# Patient Record
Sex: Female | Born: 1975
Health system: Southern US, Community
[De-identification: ages and names within clinical notes are randomized; demographics above are authoritative.]

## PROBLEM LIST (undated history)

## (undated) DIAGNOSIS — I10 Essential (primary) hypertension: Secondary | ICD-10-CM

## (undated) HISTORY — PX: BREAST SURGERY: SHX581

---

## 2009-02-26 ENCOUNTER — Ambulatory Visit: Payer: Self-pay | Admitting: Diagnostic Radiology

## 2009-02-26 ENCOUNTER — Emergency Department (HOSPITAL_BASED_OUTPATIENT_CLINIC_OR_DEPARTMENT_OTHER): Admission: EM | Admit: 2009-02-26 | Discharge: 2009-02-26 | Payer: Self-pay | Admitting: Emergency Medicine

## 2011-05-24 IMAGING — CR DG CHEST 2V
2 series · 2 of 2 positions shown · non-contrast
Comparison: None

CLINICAL DATA: Cough

CHEST - 2 VIEW

[w chest pa]
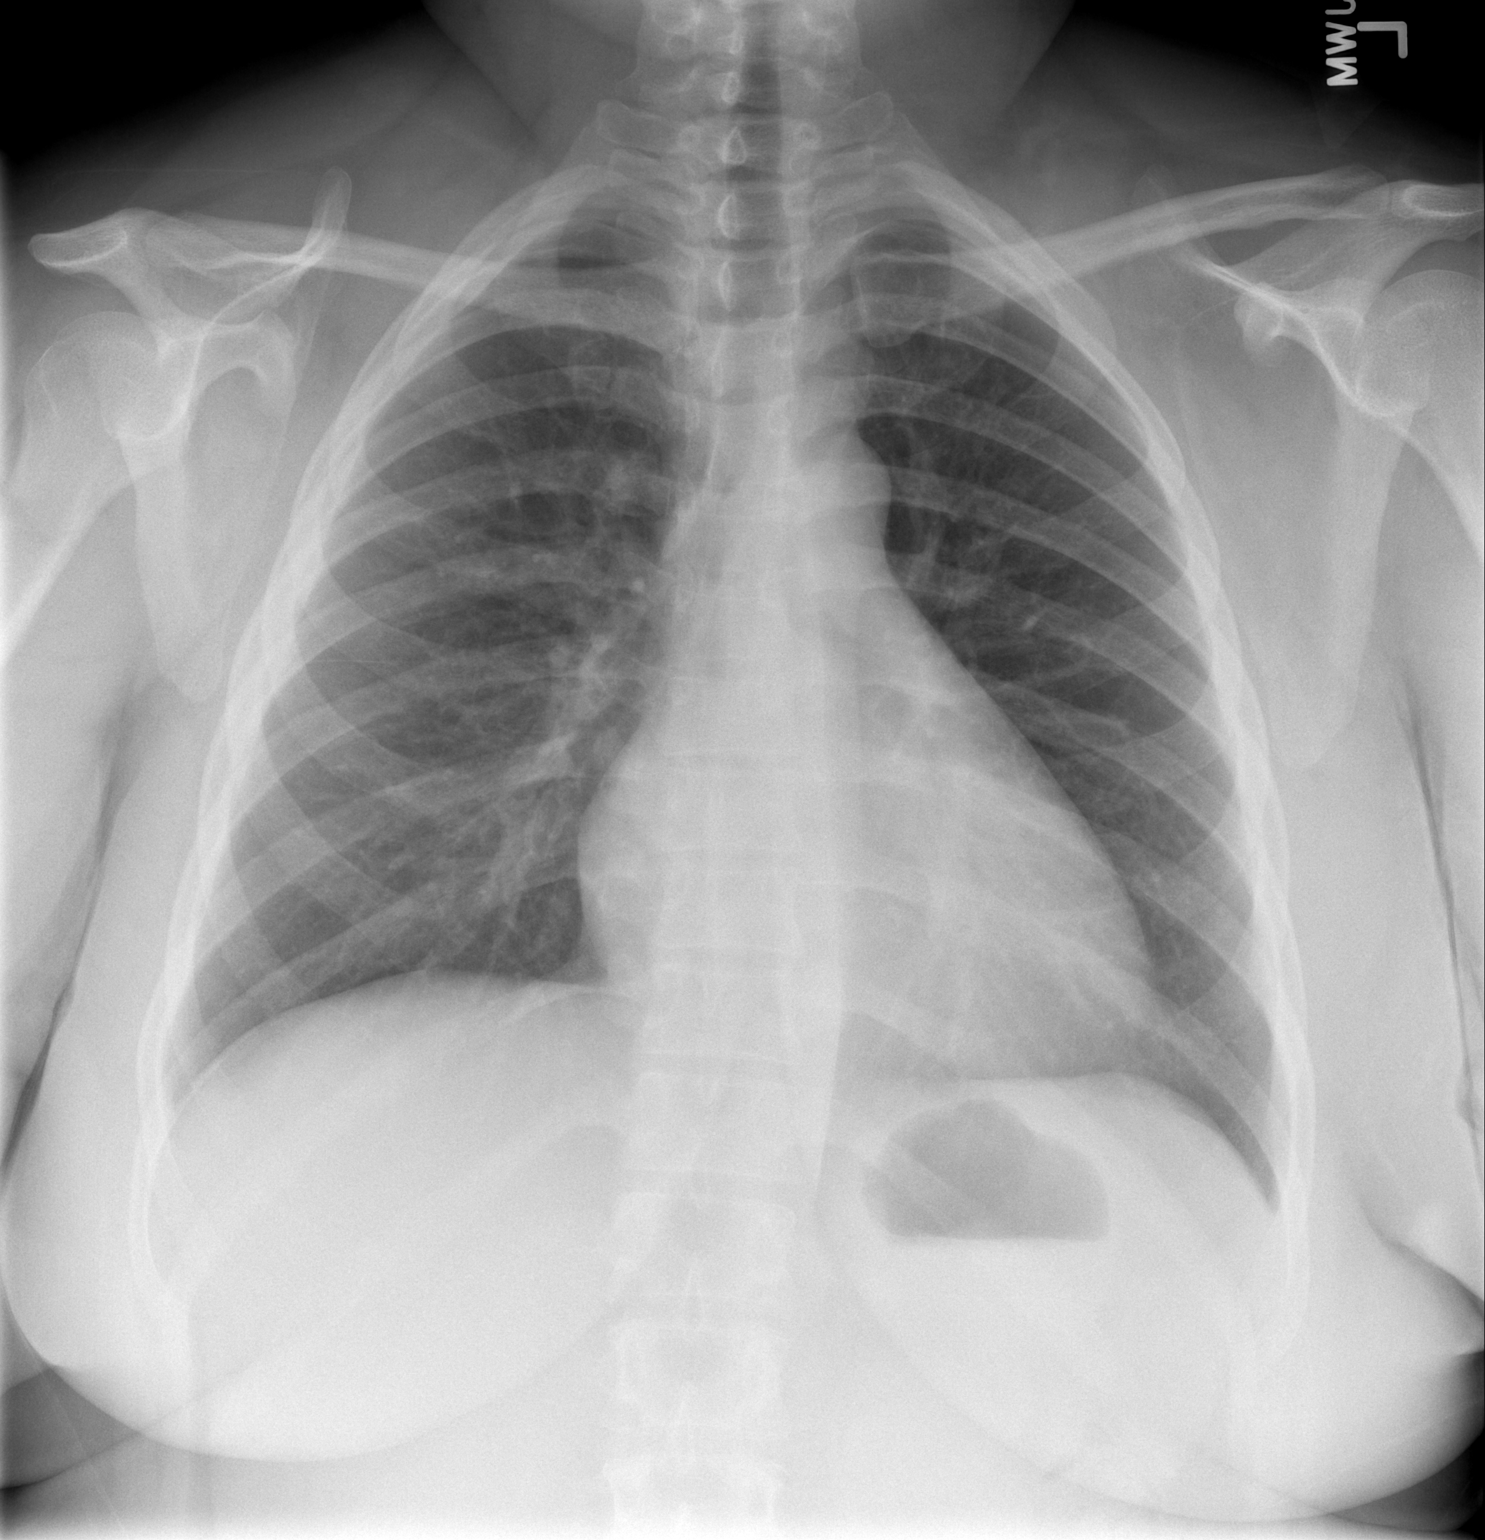

[w chest lat]
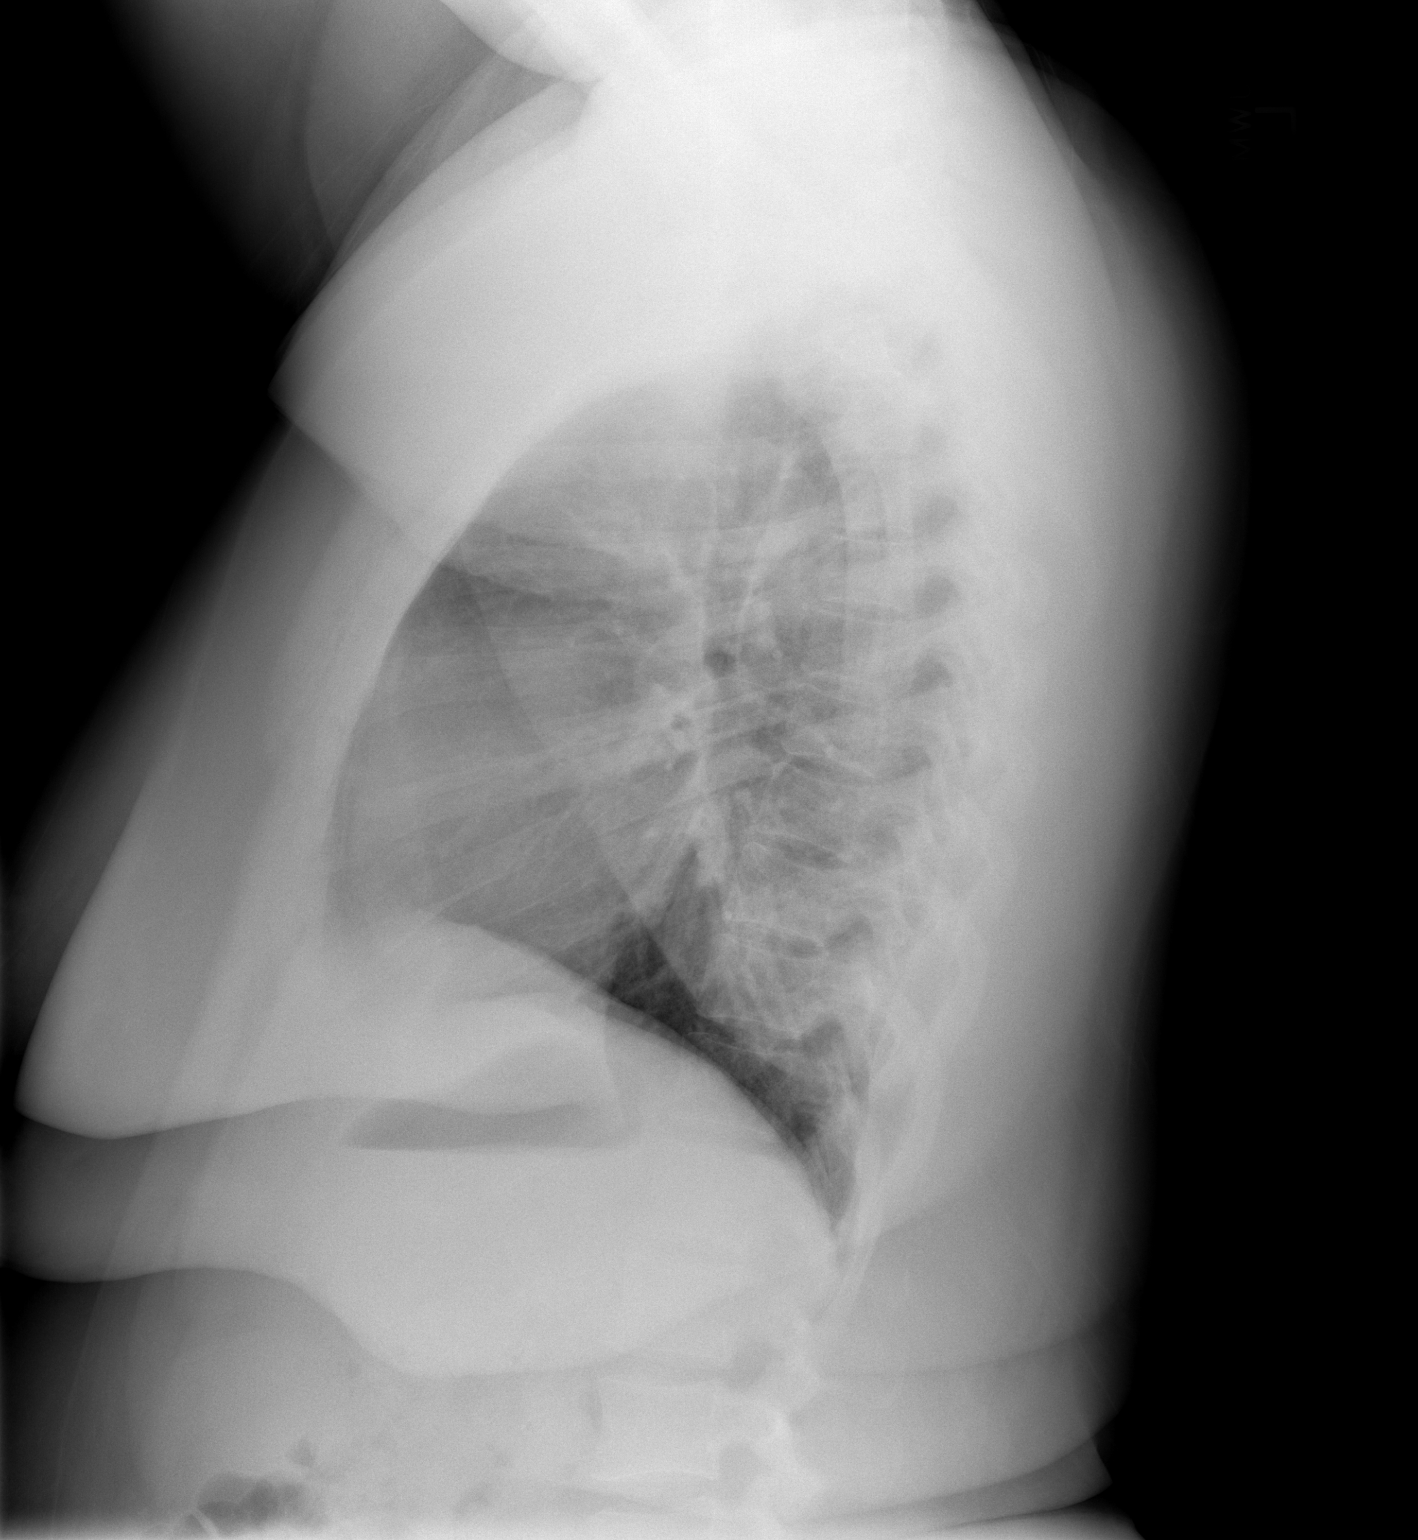

[2 of 2 positions shown; findings below may reference images not displayed]

FINDINGS: Cardiomediastinal silhouette is unremarkable.  No acute
infiltrate or pleural effusion.  No pulmonary edema. Bony thorax is
unremarkable.
IMPRESSION: No active disease.

## 2019-11-05 ENCOUNTER — Other Ambulatory Visit: Payer: Self-pay

## 2019-11-05 ENCOUNTER — Emergency Department (HOSPITAL_BASED_OUTPATIENT_CLINIC_OR_DEPARTMENT_OTHER)
Admission: EM | Admit: 2019-11-05 | Discharge: 2019-11-05 | Disposition: A | Discharge status: Home / Self Care | Payer: No Typology Code available for payment source | Attending: Emergency Medicine | Admitting: Emergency Medicine

## 2019-11-05 ENCOUNTER — Encounter (HOSPITAL_BASED_OUTPATIENT_CLINIC_OR_DEPARTMENT_OTHER): Payer: Self-pay | Admitting: *Deleted

## 2019-11-05 DIAGNOSIS — Z79899 Other long term (current) drug therapy: Secondary | ICD-10-CM | POA: Insufficient documentation

## 2019-11-05 DIAGNOSIS — Y92199 Unspecified place in other specified residential institution as the place of occurrence of the external cause: Secondary | ICD-10-CM | POA: Insufficient documentation

## 2019-11-05 DIAGNOSIS — Y9389 Activity, other specified: Secondary | ICD-10-CM | POA: Insufficient documentation

## 2019-11-05 DIAGNOSIS — I10 Essential (primary) hypertension: Secondary | ICD-10-CM | POA: Insufficient documentation

## 2019-11-05 DIAGNOSIS — Y990 Civilian activity done for income or pay: Secondary | ICD-10-CM | POA: Diagnosis not present

## 2019-11-05 DIAGNOSIS — Z23 Encounter for immunization: Secondary | ICD-10-CM | POA: Insufficient documentation

## 2019-11-05 DIAGNOSIS — S41151A Open bite of right upper arm, initial encounter: Secondary | ICD-10-CM | POA: Insufficient documentation

## 2019-11-05 DIAGNOSIS — W503XXA Accidental bite by another person, initial encounter: Secondary | ICD-10-CM

## 2019-11-05 HISTORY — DX: Essential (primary) hypertension: I10

## 2019-11-05 MED ORDER — AMOXICILLIN-POT CLAVULANATE 875-125 MG PO TABS
1.0000 | ORAL_TABLET | Freq: Once | ORAL | Status: AC
Start: 2019-11-05 — End: 2019-11-05
  Administered 2019-11-05: 1 via ORAL
  Filled 2019-11-05: qty 1

## 2019-11-05 MED ORDER — AMOXICILLIN-POT CLAVULANATE 875-125 MG PO TABS
1.0000 | ORAL_TABLET | Freq: Once | ORAL | Status: DC
Start: 2019-11-05 — End: 2019-11-05

## 2019-11-05 MED ORDER — AMOXICILLIN-POT CLAVULANATE 875-125 MG PO TABS: 1.0000 | ORAL_TABLET | Freq: Two times a day (BID) | ORAL | 0 refills | Status: AC

## 2019-11-05 MED ORDER — BACITRACIN ZINC 500 UNIT/GM EX OINT
TOPICAL_OINTMENT | Freq: Two times a day (BID) | CUTANEOUS | Status: DC
  Administered 2019-11-05: 1 via TOPICAL
  Filled 2019-11-05: qty 28.35

## 2019-11-05 MED ORDER — TETANUS-DIPHTH-ACELL PERTUSSIS 5-2.5-18.5 LF-MCG/0.5 IM SUSP
0.5000 mL | Freq: Once | INTRAMUSCULAR | Status: AC
Start: 2019-11-05 — End: 2019-11-05
  Administered 2019-11-05: 0.5 mL via INTRAMUSCULAR
  Filled 2019-11-05: qty 0.5

## 2019-11-05 NOTE — ED Triage Notes (Signed)
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

## 2019-11-05 NOTE — Discharge Instructions (Signed)
Please keep the wound clean and dry and make sure you follow-up in 2 days for recheck.  Return sooner if you have any new or worsening symptoms

## 2019-11-05 NOTE — ED Provider Notes (Signed)
[facility] EMERGENCY DEPARTMENT Provider Note   CSN: [id number] Arrival date & time: 11/05/19  1146     History Chief Complaint  Patient presents with  . Human Bite    Bianca Rodriguez is a 44 y.o. female.  Patient is a 44 year old female with past medical history of hypertension presenting to the emergency department for human bite to her right upper arm which occurred at work just prior to arrival.  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  Reports she was helping with a patient and the patient bit her in the arm.  Last tetanus shot 2012.        Past Medical History:  Diagnosis Date  . Hypertension     There are no problems to display for this patient.   Past Surgical History:  Procedure Laterality Date  . BREAST SURGERY    . CESAREAN SECTION       OB History   No obstetric history on file.     No family history on file.  Social History   Tobacco Use  . Smoking status: Never Smoker  . Smokeless tobacco: Never Used  Substance Use Topics  . Alcohol use: Never  . Drug use: Never    Home Medications Prior to Admission medications   Medication Sig Start Date End Date Taking? Authorizing Provider  chlorthalidone (HYGROTON) 50 MG tablet Take by mouth. 07/03/15  Yes [provider]  losartan (COZAAR) 25 MG tablet Take by mouth. 11/10/17  Yes [provider]  PHENTERMINE HCL PO Take by mouth.   Yes [provider]    Allergies    Lisinopril  Review of Systems   Review of Systems  Constitutional: Negative for fever.  Skin: Positive for wound.  Allergic/Immunologic: Negative for immunocompromised state.  All other systems reviewed and are negative.   Physical Exam Updated Vital Signs BP (!) 136/95   Pulse 100   Temp 98.6 F (37 C) (Oral)   Resp 18   Ht 4\' 11"  (1.499 m)   Wt 88.9 kg   LMP 10/12/2019   SpO2 100%   BMI 39.59 kg/m   Physical Exam Vitals and nursing note reviewed.    Constitutional:      Appearance: Normal appearance.  HENT:     Head: Normocephalic.  Eyes:     Conjunctiva/sclera: Conjunctivae normal.  Pulmonary:     Effort: Pulmonary effort is normal.  Skin:    General: Skin is dry.     Comments: There is 1/2 cm x 1 cm abrasion to the lateral right upper arm.  The abrasion is about 3 mm thick.  There is an area of missing skin which appears to be bitten off.  Neurological:     Mental Status: She is alert.  Psychiatric:        Mood and Affect: Mood normal.     ED Results / Procedures / Treatments   Labs (all labs ordered are listed, but only abnormal results are displayed) Labs Reviewed - No data to display  EKG None  Radiology No results found.  Procedures Procedures (including critical care time)  Medications Ordered in ED Medications  bacitracin ointment (1 application Topical Given 11/05/19 1355)  amoxicillin-clavulanate (AUGMENTIN) 875-125 MG per tablet 1 tablet (has no administration in time range)  amoxicillin-clavulanate (AUGMENTIN) 875-125 MG per tablet 1 tablet (1 tablet Oral Given 11/05/19 1356)  Tdap (BOOSTRIX) injection 0.5 mL (0.5 mLs Intramuscular Given 11/05/19 1357)    ED  Course  I have reviewed the triage vital signs and the nursing notes.  Pertinent labs & imaging results that were available during my care of the patient were reviewed by me and considered in my medical decision making (see chart for details).  Clinical Course as of Nov 05 1419  Tue Nov 05, 2019  1420 Patient with human bite from dementia patient at work today.  Tetanus shot was updated.  She was started on Augmentin.  The wound was cleaned and dressed with bacitracin.  It is a deep abrasion/bite mark with skin missing which is not amendable to suturing at this time.  Advised to follow-up in 2 days for wound check.   [KM]    Clinical Course User Index [KM] Jeral Pinch   MDM Rules/Calculators/A&P                          Based on  review of vitals, medical screening exam, lab work and/or imaging, there does not appear to be an acute, emergent etiology for the patient's symptoms. Counseled pt on good return precautions and encouraged both PCP and ED follow-up as needed.  Prior to discharge, I also discussed incidental imaging findings with patient in detail and advised appropriate, recommended follow-up in detail.  Clinical Impression: 1. Human bite, initial encounter     Disposition: Discharge  Prior to providing a prescription for a controlled substance, I independently reviewed the patient's recent prescription history on the [place] Controlled Substance Reporting System. The patient had no recent or regular prescriptions and was deemed appropriate for a brief, less than 3 day prescription of narcotic for acute analgesia.  This note was prepared with assistance of Conservation officer, historic buildings. Occasional wrong-word or sound-a-like substitutions may have occurred due to the inherent limitations of voice recognition software.  Final Clinical Impression(s) / ED Diagnoses Final diagnoses:  Human bite, initial encounter    Rx / DC Orders ED Discharge Orders    None       Jeral Pinch 11/05/19 1421    Tegeler, Canary Brim, MD 11/05/19 1558

## 2019-11-08 ENCOUNTER — Encounter (HOSPITAL_BASED_OUTPATIENT_CLINIC_OR_DEPARTMENT_OTHER): Payer: Self-pay

## 2019-11-08 ENCOUNTER — Other Ambulatory Visit: Payer: Self-pay

## 2019-11-08 ENCOUNTER — Emergency Department (HOSPITAL_BASED_OUTPATIENT_CLINIC_OR_DEPARTMENT_OTHER)
Admission: EM | Admit: 2019-11-08 | Discharge: 2019-11-08 | Disposition: A | Discharge status: Home / Self Care | Payer: No Typology Code available for payment source | Attending: Emergency Medicine | Admitting: Emergency Medicine

## 2019-11-08 DIAGNOSIS — I10 Essential (primary) hypertension: Secondary | ICD-10-CM | POA: Diagnosis not present

## 2019-11-08 DIAGNOSIS — Z5189 Encounter for other specified aftercare: Secondary | ICD-10-CM

## 2019-11-08 DIAGNOSIS — Z79899 Other long term (current) drug therapy: Secondary | ICD-10-CM | POA: Insufficient documentation

## 2019-11-08 DIAGNOSIS — Z4800 Encounter for change or removal of nonsurgical wound dressing: Secondary | ICD-10-CM | POA: Insufficient documentation

## 2019-11-08 DIAGNOSIS — L03113 Cellulitis of right upper limb: Secondary | ICD-10-CM | POA: Insufficient documentation

## 2019-11-08 MED ORDER — SULFAMETHOXAZOLE-TRIMETHOPRIM 800-160 MG PO TABS: 1.0000 | ORAL_TABLET | Freq: Two times a day (BID) | ORAL | 0 refills | Status: AC

## 2019-11-08 MED ORDER — LIDOCAINE-EPINEPHRINE 1 %-1:100000 IJ SOLN
INTRAMUSCULAR | Status: AC
  Filled 2019-11-08: qty 1

## 2019-11-08 MED ORDER — LIDOCAINE-EPINEPHRINE (PF) 2 %-1:200000 IJ SOLN: 10.0000 mL | Freq: Once | INTRAMUSCULAR | Status: DC

## 2019-11-08 MED FILL — SULFAMETHOXAZOLE-TMP DS TAB: 800-160 | 7 days supply | Qty: 14 | Fill #0

## 2019-11-08 NOTE — ED Provider Notes (Signed)
[facility] EMERGENCY DEPARTMENT Provider Note   CSN: [id number] Arrival date & time: 11/08/19  1134     History Chief Complaint  Patient presents with  . Wound Check    Bianca Rodriguez is a 44 y.o. female with past medical history of hypertension, prediabetes, who presents today for evaluation of a wound check.  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  She was started on Augmentin and her tetanus was updated.  She reports that since yesterday she has noted worsening redness and swelling.  She denies any changes in the pain.  No fevers.  She reports compliance with the Augmentin that she was prescribed and has been putting topical antibiotic ointment on it.  She denies any new pain up the arm. She feels overall well.   HPI     Past Medical History:  Diagnosis Date  . Hypertension     There are no problems to display for this patient.   Past Surgical History:  Procedure Laterality Date  . BREAST SURGERY    . CESAREAN SECTION       OB History   No obstetric history on file.     No family history on file.  Social History   Tobacco Use  . Smoking status: Never Smoker  . Smokeless tobacco: Never Used  Substance Use Topics  . Alcohol use: Never  . Drug use: Never    Home Medications Prior to Admission medications   Medication Sig Start Date End Date Taking? Authorizing Provider  amoxicillin-clavulanate (AUGMENTIN) 875-125 MG tablet Take 1 tablet by mouth every 12 (twelve) hours. 11/05/19   Arlyn Dunning, PA-C  chlorthalidone (HYGROTON) 50 MG tablet Take by mouth. 07/03/15   [provider]  losartan (COZAAR) 25 MG tablet Take by mouth. 11/10/17   [provider]  PHENTERMINE HCL PO Take by mouth.    [provider]  sulfamethoxazole-trimethoprim (BACTRIM DS) 800-160 MG tablet Take 1 tablet by mouth 2 (two) times daily for 7 days. 11/08/19 11/15/19  Cristina Gong, PA-C    Allergies    Lisinopril and Hydrochlorothiazide  Review of Systems   Review of Systems  Constitutional: Negative for chills and fever.  Gastrointestinal: Negative for abdominal pain.  Skin: Positive for color change and wound.  Neurological: Negative for weakness and headaches.  All other systems reviewed and are negative.   Physical Exam Updated Vital Signs BP 132/84 (BP Location: Right Arm)   Pulse 73   Temp 97.7 F (36.5 C) (Oral)   Resp 16   Ht 4\' 11"  (1.499 m)   Wt 87.5 kg   LMP 10/12/2019   SpO2 99%   BMI 38.96 kg/m   Physical Exam Vitals and nursing note reviewed.  Constitutional:      General: She is not in acute distress.    Appearance: She is not ill-appearing.  HENT:     Head: Normocephalic.  Cardiovascular:     Rate and Rhythm: Normal rate.  Pulmonary:     Effort: Pulmonary effort is normal. No respiratory distress.  Musculoskeletal:     Comments: Full active range of motion of the right upper extremity  Skin:    Comments: Please see clinical pictures.  There is devitalized skin present in the wound with surrounding edema and erythema.  The area is warm to the touch.  No palpable fluctuance however mild surrounding induration.  There is adipose  tissue visualized in the wound bed however no active purulent drainage.  Neurological:     General: No focal deficit present.     Mental Status: She is alert.  Psychiatric:        Mood and Affect: Mood normal.        Behavior: Behavior normal.           ED Results / Procedures / Treatments   Labs (all labs ordered are listed, but only abnormal results are displayed) Labs Reviewed - No data to display  EKG None  Radiology No results found.  Procedures .Marland KitchenIncision and Drainage  Date/Time: 11/08/2019 3:38 PM Performed by: Cristina Gong, PA-C Authorized by: Cristina Gong, PA-C   Consent:    Consent obtained:  Verbal   Consent given by:  Patient   Risks  discussed:  Bleeding, incomplete drainage, pain, infection and damage to other organs   Alternatives discussed:  No treatment, alternative treatment and referral Location:    Type:  Fluid collection   Size:  3x3cm   Location:  Upper extremity   Upper extremity location:  Arm   Arm location:  R upper arm Pre-procedure details:    Skin preparation:  Betadine Anesthesia (see MAR for exact dosages):    Anesthesia method:  Local infiltration   Local anesthetic:  Lidocaine 1% WITH epi Procedure type:    Complexity:  Complex Procedure details:    Needle aspiration: no     Incision type: Stab incision with debridement of nonviable tissue.   Incision depth:  Subcutaneous   Scalpel blade:  11   Wound management:  Debrided   Drainage:  Bloody and serous   Drainage amount:  Scant   Wound treatment:  Wound left open   Packing materials:  None Post-procedure details:    Patient tolerance of procedure:  Tolerated well, no immediate complications Comments:     Repeat bedside ultrasound was used during procedure (please see note by Dr. Pilar Plate for initial), after devitalized tissue was removed there was a small amount of drainage.  Repeat ultrasound using sterile ultrasound probe cover and jelly shows no clear residual fluid collection.    (including critical care time)  Medications Ordered in ED Medications  lidocaine-EPINEPHrine (XYLOCAINE W/EPI) 2 %-1:200000 (PF) injection 10 mL (has no administration in time range)  lidocaine-EPINEPHrine (XYLOCAINE W/EPI) 1 %-1:100000 (with pres) injection (has no administration in time range)    ED Course  I have reviewed the triage vital signs and the nursing notes.  Pertinent labs & imaging results that were available during my care of the patient were reviewed by me and considered in my medical decision making (see chart for details).    MDM Rules/Calculators/A&P                         Patient is a 44 year old woman who presents today for  evaluation of a wound check.  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  She noted worsening redness swelling and yesterday.  On exam concern for infection.  She has been taking Augmentin and not missing any doses.  Overall she is afebrile, not tachycardic or tachypneic, without systemic symptoms and is well-appearing.  Ultrasound soft tissue was performed showing concern for abscess.  I&D was performed, wound was debrided, after which repeat ultrasound did not show any residual fluid collection. Given her worsening will add bactrim for MRSA coverage.  I suspect that with the removal of the devitalized tissue (approx 1cmx0.75cm) the wound will improve given suspected source control.   Recommended that she follow-up with her primary care doctor on Monday for a wound recheck, or return to the emergency room if unable to be seen.  Additionally recommended that if her symptoms worsen, she develops fevers, proximal red streaking, worsening pain, or has other concerns she needs to return to the emergency room for further evaluation.    Return precautions were discussed with patient who states their understanding.  At the time of discharge patient denied any unaddressed complaints or concerns.  Patient is agreeable for discharge home.  Note: Portions of this report may have been transcribed using voice recognition software. Every effort was made to ensure accuracy; however, inadvertent computerized transcription errors may be present  This patient was seen as a shared visit with Dr. Pilar Plate.    Final Clinical Impression(s) / ED Diagnoses Final diagnoses:  Visit for wound check    Rx / DC Orders ED Discharge Orders         Ordered    sulfamethoxazole-trimethoprim (BACTRIM DS) 800-160 MG tablet  2 times daily     Discontinue  Reprint     11/08/19 1447           Cristina Gong, [place] 11/08/19 1545    Sabas Sous, MD 11/11/19 [phone]

## 2019-11-08 NOTE — ED Triage Notes (Signed)
Pt arrives ambulatory for wound recheck to right upper outer arm from human bite at work. Pt has been using antibiotic prescribed at last visit and using ointment.

## 2019-11-08 NOTE — Discharge Instructions (Signed)
Today I have given you a prescription for an additional antibiotic.  I suspect that your wound was worsening due to the dead skin.  Please continue to take the Augmentin in addition to the Bactrim.  Please take Ibuprofen (Advil, motrin) and Tylenol (acetaminophen) to relieve your pain.  You may take up to 600 MG (3 pills) of normal strength ibuprofen every 8 hours as needed.  In between doses of ibuprofen you make take tylenol, up to 1,000 mg (two extra strength pills).  Do not take more than 3,000 mg tylenol in a 24 hour period.  Please check all medication labels as many medications such as pain and cold medications may contain tylenol.  Do not drink alcohol while taking these medications.  Do not take other NSAID'S while taking ibuprofen (such as aleve or naproxen).  Please take ibuprofen with food to decrease stomach upset.  You may have diarrhea from the antibiotics.  It is very important that you continue to take the antibiotics even if you get diarrhea unless a medical professional tells you that you may stop taking them.  If you stop too early the bacteria you are being treated for will become stronger and you may need different, more powerful antibiotics that have more side effects and worsening diarrhea.  Please stay well hydrated and consider probiotics as they may decrease the severity of your diarrhea.  Please be aware that if you take any hormonal contraception (birth control pills, nexplanon, the ring, etc) that your birth control will not work while you are taking antibiotics and you need to use back up protection as directed on the birth control medication information insert.

## 2019-11-11 NOTE — ED Provider Notes (Signed)
Ultrasound ED Soft Tissue  Date/Time: 11/11/2019 7:01 AM Performed by: Sabas Sous, MD Authorized by: Sabas Sous, MD   Procedure details:    Indications: localization of abscess and evaluate for cellulitis     Transverse view:  Visualized   Longitudinal view:  Visualized   Images: archived   Location:    Location: upper extremity     Side:  Right Findings:     abscess present    cellulitis present Comments:     Small superficial hypoechoic and heterogeneous collection, potential abscess      Sabas Sous, MD 11/11/19 [phone]

## 2020-03-22 ENCOUNTER — Other Ambulatory Visit: Payer: Self-pay

## 2020-03-22 ENCOUNTER — Emergency Department (HOSPITAL_BASED_OUTPATIENT_CLINIC_OR_DEPARTMENT_OTHER)
Admission: EM | Admit: 2020-03-22 | Discharge: 2020-03-22 | Disposition: A | Discharge status: Home / Self Care | Payer: 59 | Attending: Emergency Medicine | Admitting: Emergency Medicine

## 2020-03-22 ENCOUNTER — Encounter (HOSPITAL_BASED_OUTPATIENT_CLINIC_OR_DEPARTMENT_OTHER): Payer: Self-pay | Admitting: Emergency Medicine

## 2020-03-22 DIAGNOSIS — I10 Essential (primary) hypertension: Secondary | ICD-10-CM | POA: Diagnosis not present

## 2020-03-22 DIAGNOSIS — R059 Cough, unspecified: Secondary | ICD-10-CM | POA: Diagnosis present

## 2020-03-22 DIAGNOSIS — Z20822 Contact with and (suspected) exposure to covid-19: Secondary | ICD-10-CM | POA: Insufficient documentation

## 2020-03-22 DIAGNOSIS — Z79899 Other long term (current) drug therapy: Secondary | ICD-10-CM | POA: Insufficient documentation

## 2020-03-22 DIAGNOSIS — J069 Acute upper respiratory infection, unspecified: Secondary | ICD-10-CM | POA: Insufficient documentation

## 2020-03-22 LAB — GROUP A STREP BY PCR: Group A Strep by PCR: NOT DETECTED

## 2020-03-22 LAB — RESP PANEL BY RT-PCR (FLU A&B, COVID) ARPGX2
Influenza A by PCR: NEGATIVE
Influenza B by PCR: NEGATIVE
SARS Coronavirus 2 by RT PCR: NEGATIVE

## 2020-03-22 NOTE — ED Triage Notes (Signed)
Reports sore throat on Friday.  Had a rapid that was negative for covid.  Symptoms getting worse since.  Cough, congestion, SOB, body aches, runny nose, ear pain, headache, and fatigue.

## 2020-03-22 NOTE — ED Provider Notes (Signed)
[facility] EMERGENCY DEPARTMENT Provider Note   CSN: [id number] Arrival date & time: 03/22/20  1630     History Chief Complaint  Patient presents with  . URI    Bianca Rodriguez is a 44 y.o. female.  HPI Patient presents with URI symptoms.  Sore throat initially then had cough and myalgias.  Started around 2 to 3 days ago.  Had negative rapid Covid test.  Has had runny nose headache some fatigue.  Works at a memory care unit.  States there are no known issues going around right now.  Has had her Covid vaccine.  No swelling in her legs.  No difficulty swallowing.    Past Medical History:  Diagnosis Date  . Hypertension     There are no problems to display for this patient.   Past Surgical History:  Procedure Laterality Date  . BREAST SURGERY    . CESAREAN SECTION       OB History   No obstetric history on file.     No family history on file.  Social History   Tobacco Use  . Smoking status: Never Smoker  . Smokeless tobacco: Never Used  Substance Use Topics  . Alcohol use: Never  . Drug use: Never    Home Medications Prior to Admission medications   Medication Sig Start Date End Date Taking? Authorizing Provider  amoxicillin-clavulanate (AUGMENTIN) 875-125 MG tablet Take 1 tablet by mouth every 12 (twelve) hours. 11/05/19   Arlyn Dunning, PA-C  chlorthalidone (HYGROTON) 50 MG tablet Take by mouth. 07/03/15   [provider]  losartan (COZAAR) 25 MG tablet Take by mouth. 11/10/17   [provider]  PHENTERMINE HCL PO Take by mouth.    [provider]    Allergies    Lisinopril and Hydrochlorothiazide  Review of Systems   Review of Systems  Constitutional: Positive for appetite change and fatigue. Negative for fever.  HENT: Positive for congestion and sore throat. Negative for trouble swallowing.   Respiratory: Positive for cough.   Cardiovascular: Negative for chest pain.  Gastrointestinal: Negative for abdominal  pain.  Endocrine: Negative for polyuria.  Genitourinary: Negative for dysuria.  Musculoskeletal: Positive for myalgias. Negative for back pain.  Skin: Negative for rash.  Neurological: Positive for headaches. Negative for weakness.  Psychiatric/Behavioral: Negative for confusion.    Physical Exam Updated Vital Signs BP (!) 158/97 (BP Location: Left Arm)   Pulse (!) 108   Temp 98.7 F (37.1 C) (Oral)   Resp 18   Ht 5\' 2"  (1.575 m)   Wt 90.7 kg   SpO2 100%   BMI 36.58 kg/m   Physical Exam Vitals and nursing note reviewed.  Constitutional:      Appearance: Normal appearance.  HENT:     Head: Atraumatic.     Mouth/Throat:     Comments: Minimal posterior pharyngeal erythema.  No exudate. Eyes:     Pupils: Pupils are equal, round, and reactive to light.  Cardiovascular:     Rate and Rhythm: Regular rhythm.  Pulmonary:     Breath sounds: No wheezing, rhonchi or rales.  Chest:     Chest wall: No tenderness.  Abdominal:     Tenderness: There is no abdominal tenderness.  Musculoskeletal:        General: No tenderness.     Cervical back: Neck supple.  Skin:    General: Skin is warm.     Capillary Refill: Capillary refill takes less than 2 seconds.  Neurological:  Mental Status: She is alert and oriented to person, place, and time.  Psychiatric:        Mood and Affect: Mood normal.     ED Results / Procedures / Treatments   Labs (all labs ordered are listed, but only abnormal results are displayed) Labs Reviewed  RESP PANEL BY RT-PCR (FLU A&B, COVID) [id number]  GROUP A STREP BY PCR    EKG None  Radiology No results found.  Procedures Procedures (including critical care time)  Medications Ordered in ED Medications - No data to display  ED Course  I have reviewed the triage vital signs and the nursing notes.  Pertinent labs & imaging results that were available during my care of the patient were reviewed by me and considered in my medical decision making  (see chart for details).    MDM Rules/Calculators/A&P                         Patient with URI symptoms.  Is had around 3 days of symptoms.  Has negative Covid flu and RSV test here.  However with sore throat strep test and it was negative.  Well-appearing.  Discharge home.  Final Clinical Impression(s) / ED Diagnoses Final diagnoses:  Upper respiratory tract infection, unspecified type    Rx / DC Orders ED Discharge Orders    None       Benjiman Core, MD 03/22/20 1858
